# Patient Record
Sex: Male | Born: 1955
Health system: Southern US, Community
[De-identification: ages and names within clinical notes are randomized; demographics above are authoritative.]

## PROBLEM LIST (undated history)

## (undated) DIAGNOSIS — G8929 Other chronic pain: Secondary | ICD-10-CM

## (undated) DIAGNOSIS — C439 Malignant melanoma of skin, unspecified: Secondary | ICD-10-CM

---

## 2001-12-26 ENCOUNTER — Ambulatory Visit (HOSPITAL_COMMUNITY): Admission: RE | Admit: 2001-12-26 | Discharge: 2001-12-26 | Payer: Self-pay | Admitting: Family Medicine

## 2001-12-26 ENCOUNTER — Encounter: Payer: Self-pay | Admitting: Family Medicine

## 2001-12-28 ENCOUNTER — Encounter: Payer: Self-pay | Admitting: Urology

## 2001-12-28 ENCOUNTER — Ambulatory Visit (HOSPITAL_COMMUNITY): Admission: RE | Admit: 2001-12-28 | Discharge: 2001-12-28 | Payer: Self-pay | Admitting: Urology

## 2002-01-09 ENCOUNTER — Ambulatory Visit (HOSPITAL_COMMUNITY): Admission: RE | Admit: 2002-01-09 | Discharge: 2002-01-09 | Payer: Self-pay | Admitting: Urology

## 2002-01-09 ENCOUNTER — Encounter: Payer: Self-pay | Admitting: Urology

## 2002-01-18 ENCOUNTER — Ambulatory Visit (HOSPITAL_COMMUNITY): Admission: RE | Admit: 2002-01-18 | Discharge: 2002-01-18 | Payer: Self-pay | Admitting: Urology

## 2002-01-18 ENCOUNTER — Encounter: Payer: Self-pay | Admitting: Urology

## 2002-02-08 ENCOUNTER — Encounter: Payer: Self-pay | Admitting: Urology

## 2002-02-08 ENCOUNTER — Ambulatory Visit (HOSPITAL_COMMUNITY): Admission: RE | Admit: 2002-02-08 | Discharge: 2002-02-08 | Payer: Self-pay | Admitting: Urology

## 2002-03-20 ENCOUNTER — Ambulatory Visit (HOSPITAL_COMMUNITY): Admission: RE | Admit: 2002-03-20 | Discharge: 2002-03-20 | Payer: Self-pay | Admitting: Urology

## 2002-03-20 ENCOUNTER — Encounter: Payer: Self-pay | Admitting: Urology

## 2002-04-03 ENCOUNTER — Encounter: Payer: Self-pay | Admitting: Urology

## 2002-04-03 ENCOUNTER — Ambulatory Visit (HOSPITAL_COMMUNITY): Admission: RE | Admit: 2002-04-03 | Discharge: 2002-04-03 | Payer: Self-pay | Admitting: Urology

## 2002-04-18 ENCOUNTER — Encounter: Payer: Self-pay | Admitting: Urology

## 2002-04-18 ENCOUNTER — Ambulatory Visit (HOSPITAL_COMMUNITY): Admission: RE | Admit: 2002-04-18 | Discharge: 2002-04-18 | Payer: Self-pay | Admitting: Urology

## 2002-04-20 ENCOUNTER — Encounter: Payer: Self-pay | Admitting: Urology

## 2002-04-20 ENCOUNTER — Ambulatory Visit (HOSPITAL_COMMUNITY): Admission: RE | Admit: 2002-04-20 | Discharge: 2002-04-20 | Payer: Self-pay | Admitting: Urology

## 2005-07-22 ENCOUNTER — Encounter: Admission: RE | Admit: 2005-07-22 | Discharge: 2005-07-22 | Payer: Self-pay | Admitting: Sports Medicine

## 2005-08-11 ENCOUNTER — Encounter: Admission: RE | Admit: 2005-08-11 | Discharge: 2005-08-11 | Payer: Self-pay | Admitting: Sports Medicine

## 2005-08-19 ENCOUNTER — Encounter
Admission: RE | Admit: 2005-08-19 | Discharge: 2005-11-17 | Payer: Self-pay | Admitting: Physical Medicine and Rehabilitation

## 2005-08-19 ENCOUNTER — Ambulatory Visit: Payer: Self-pay | Admitting: Physical Medicine and Rehabilitation

## 2005-08-25 ENCOUNTER — Encounter: Admission: RE | Admit: 2005-08-25 | Discharge: 2005-08-25 | Payer: Self-pay | Admitting: Sports Medicine

## 2005-09-28 ENCOUNTER — Encounter
Admission: RE | Admit: 2005-09-28 | Discharge: 2005-12-16 | Payer: Self-pay | Admitting: Physical Medicine and Rehabilitation

## 2005-09-30 ENCOUNTER — Ambulatory Visit: Payer: Self-pay | Admitting: Physical Medicine and Rehabilitation

## 2005-12-03 ENCOUNTER — Ambulatory Visit: Payer: Self-pay | Admitting: Physical Medicine and Rehabilitation

## 2005-12-03 ENCOUNTER — Encounter
Admission: RE | Admit: 2005-12-03 | Discharge: 2006-03-03 | Payer: Self-pay | Admitting: Physical Medicine and Rehabilitation

## 2006-01-27 ENCOUNTER — Ambulatory Visit: Payer: Self-pay | Admitting: Orthopedic Surgery

## 2006-02-02 ENCOUNTER — Encounter: Admission: RE | Admit: 2006-02-02 | Discharge: 2006-02-02 | Payer: Self-pay | Admitting: Family Medicine

## 2006-02-23 ENCOUNTER — Encounter: Admission: RE | Admit: 2006-02-23 | Discharge: 2006-02-23 | Payer: Self-pay | Admitting: Family Medicine

## 2006-03-18 ENCOUNTER — Encounter: Admission: RE | Admit: 2006-03-18 | Discharge: 2006-03-18 | Payer: Self-pay | Admitting: Family Medicine

## 2008-01-03 ENCOUNTER — Ambulatory Visit (HOSPITAL_COMMUNITY): Admission: RE | Admit: 2008-01-03 | Discharge: 2008-01-03 | Payer: Self-pay | Admitting: Family Medicine

## 2008-03-26 ENCOUNTER — Encounter: Admission: RE | Admit: 2008-03-26 | Discharge: 2008-03-26 | Payer: Self-pay | Admitting: Family Medicine

## 2008-04-18 ENCOUNTER — Encounter: Admission: RE | Admit: 2008-04-18 | Discharge: 2008-04-18 | Payer: Self-pay | Admitting: Family Medicine

## 2009-04-15 ENCOUNTER — Encounter (INDEPENDENT_AMBULATORY_CARE_PROVIDER_SITE_OTHER): Payer: Self-pay | Admitting: *Deleted

## 2009-04-16 ENCOUNTER — Ambulatory Visit (HOSPITAL_COMMUNITY): Admission: RE | Admit: 2009-04-16 | Discharge: 2009-04-16 | Payer: Self-pay | Admitting: Family Medicine

## 2009-05-14 ENCOUNTER — Ambulatory Visit (HOSPITAL_COMMUNITY): Admission: RE | Admit: 2009-05-14 | Discharge: 2009-05-14 | Payer: Self-pay | Admitting: Family Medicine

## 2009-09-19 ENCOUNTER — Ambulatory Visit (HOSPITAL_COMMUNITY): Admission: RE | Admit: 2009-09-19 | Discharge: 2009-09-19 | Payer: Self-pay | Admitting: Family Medicine

## 2009-09-24 ENCOUNTER — Ambulatory Visit (HOSPITAL_COMMUNITY): Admission: RE | Admit: 2009-09-24 | Discharge: 2009-09-24 | Payer: Self-pay | Admitting: Family Medicine

## 2009-09-29 ENCOUNTER — Encounter: Admission: RE | Admit: 2009-09-29 | Discharge: 2009-09-29 | Payer: Self-pay | Admitting: Family Medicine

## 2010-10-04 ENCOUNTER — Encounter: Payer: Self-pay | Admitting: Family Medicine

## 2010-10-04 ENCOUNTER — Encounter: Payer: Self-pay | Admitting: Orthopedic Surgery

## 2010-10-05 ENCOUNTER — Ambulatory Visit (HOSPITAL_COMMUNITY): Admission: RE | Admit: 2010-10-05 | Payer: Self-pay | Source: Home / Self Care | Admitting: Internal Medicine

## 2010-10-14 ENCOUNTER — Encounter: Payer: Self-pay | Admitting: Internal Medicine

## 2010-12-18 LAB — GLUCOSE, CAPILLARY: Glucose-Capillary: 91 mg/dL (ref 70–99)

## 2011-01-29 NOTE — Assessment & Plan Note (Signed)
INTERVAL HISTORY:  Theodore Gordon is a 55 year old gentleman kindly referred by  Dr. Nobie Putnam.  Theodore Gordon is being seen in our pain and rehabilitative clinic  for chronic low back pain and predominantly left leg buttock pain radiating  to the lower left leg and bilateral knee pain.   Theodore Gordon states his pain is fairly constant throughout the day however on  further questioning he can discern that it seems a little worse at the  beginning of the day and toward the end of the day.  His pain is described  as constant and varying in its degree of sharpness, dullness, occasionally  stabbing, tingling and aching.  It varies with the weather.  The pain is  again 10 on a scale of 10.  He does not sleep well.  Pain is worse with  walking, bending, sitting, standing, improves with ice and medication.  He  did have a TENS trial, it seemed to help for about 40 minutes.  He has had  some improvement with injections as well.   Currently pain medications that he is on give him between a little and fair  relief.  He currently takes Valium 10 mg two times a day, Cymbalta 60 mg  once a day, Endocet 10/325 mg up to four to six times a day.   He also is on clonazepam.   FUNCTIONAL STATUS:  The patient is able to walk between 5 and 10 minutes at  a time.  He is independent with most of his self-care, may need some  assistance with dressing.   REVIEW OF SYSTEMS:  No changes in review of systems.   PAST MEDICAL, SOCIAL, FAMILY HISTORY:  No changes in past medical, social,  or family history, other than he reports that Dr. Nobie Putnam is scheduling him  for colonoscopy and upper GI series within the next month.   EXAMINATION:  Today blood pressure is 133/75, pulse 97, respirations 16, 97%  saturated on room air.  He is a well-developed, well-nourished gentleman.  He does not appear in any distress during our interview.  He is pleasant,  cooperative, and bright in his affect.  He is oriented x3.  He is able to  stand with a little difficulty after being seated.  He appears somewhat  stiff.  His gait in the room is antalgic.  Decreased weightbearing in the  left lower extremity.  Romberg's test is negative.  Seated reflexes are  symmetric at both patellar tendons 2+.  He has 5/5 motor strength at hip  flexors, knee extensors, dorsiflexors, plantar flexors.   Again, quite a bit of tenderness over the left lateral trochanter and on the  iliotibial band and also into the left gluteal musculature.   The patient brings in multiple scans today:  Bilateral knee MRIs dated  July 09, 2005, MRI of the pelvis dated July 09, 2005 and lumbar MRI  July 21,2006.   I do not have any radiologic report to go with these, we will be requesting  that.   Urine drug screen was positive for benzodiazepines and marijuana metabolites  which was repeated and verified.   IMPRESSION:  1.  Lumbar disk disease, mild.  2.  Lumbar facet syndrome, mild.  3.  Left trochanteric bursitis/iliotibial band syndrome.  4.  Intermittent left sciatic symptoms.  5.  Illegal substance use on urine drug screen dated August 20, 2005      positive for marijuana metabolites.   PLAN:  The patient has just gotten  started in his physical therapy program,  would like him to pursue this vigorously; education and proper body  mechanics, ergonomics, postural training, pain management techniques,  ultrasound to the iliotibial band and soft tissue work in the gluteal  musculature.  He has been assessed for a TENS unit, appears that he does get  some benefit, would like to go ahead and get this ordered for him.  We will  do a trial of gabapentin in the evening to see if we can help him rest  better.  We also discussed the possibility of either epidurals or medial  branch blocks in the upcoming months as well.  We also discussed the  possibility of adding a nonsteroidal and possibly ibuprofen 600 in the  morning.  Would like to wait for  results from colonoscopy and upper GI prior  to starting him on this.  Our goal is to minimize use of Endocet if we can.   Due to use of illegal substances, this clinic will not be providing narcotic  substances for this gentleman but we will be happy to continue to treat him  in a non-narcotic capacity.   We will see him back in 6 weeks.           ______________________________  Brantley Stage, M.D.     DMK/MedQ  D:  10/01/2005 14:16:58  T:  10/01/2005 18:02:40  Job #:  [id number]   cc:   Patrica Duel, M.D.  Fax: [phone]
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
## 2011-01-29 NOTE — H&P (Signed)
NAME:  Theodore Gordon, Theodore Gordon                              ACCOUNT NO.:  [id number]   MEDICAL RECORD NO.:  [id number]                   PATIENT TYPE:  AMB   LOCATION:  DAY                                  FACILITY:  [facility]   PHYSICIAN:  Dennie Maizes, M.D.                DATE OF BIRTH:  [birth year]   DATE OF ADMISSION:  04/18/2002  DATE OF DISCHARGE:                                HISTORY & PHYSICAL   CHIEF COMPLAINT:  Severe left flank pain, left upper ureter calculus with  obstruction.   HISTORY OF PRESENT ILLNESS:  This 55 year old male was initially referred to  me by Dr.  Nobie Putnam in April 2003 for evaluation of left flank pain.  X-rays  revealed a large left renal calculus measuring 20 x 35 x 15 mm in size.  The  patient underwent cystoscopy, left ureteral stent placement, and ESWL of the  left renal calculus.  Followup x-rays revealed that the stone was fragmented  well.  The ureteral stent was removed, and the patient passed several stone  fragments.  He has a 10 x 7 mm size left upper ureteral stone fragment at  the lower left L5 transverse process.  He is unable to pass the stone, and  he is quit symptomatic from the stone at present.  He has intermittent  severe low flank pain. There is no history of fever, chills, gross hematuria  or dysuria.  He is brought to the day hospital today for ESL of the left  upper ureter calculus with IV sedation.   PAST MEDICAL HISTORY:  No medical illnesses.  History of urolithiasis status  post ESL of left renal calculus in April 2003.  Status post cholecystectomy,  appendectomy, right inguinal hernia repair, bilateral vasectomy, history of  chronic back pain.   MEDICATIONS:  1. Darvocet-N 100 p.r.n. for pain.  2. Valium 5 mg p.o. q.8h. for back spasm.   ALLERGIES:  CODEINE.   REVIEW OF SYSTEMS:  Negative.   FAMILY HISTORY:  Positive for kidney stones, colon cancer, hypertension.   PHYSICAL EXAMINATION:  VITAL SIGNS:  Height 5 feet 11 inches,  weight 185  pounds.  HEENT:  Normal.  NECK:  No masses.  LUNGS:  Clear to auscultation.  HEART:  Regular rate and rhythm, no murmurs.  ABDOMEN:  Soft.  No palpable flank mass.  Moderate costovertebral angle  tenderness as noted.  Bladder not palpable.  GU:  Penis and testes are normal.  There is a 5 cm size right spermatocele.  RECTAL:  Examination not done.   IMPRESSION:  1. Left renal colic.  2. Left upper ureteral calculus with obstruction.   PLAN:  I discussed with the patient regarding the diagnosis and management  options.  He is scheduled to undergo extracorporeal shock wave lithotripsy  of the left upper ureteral calculus under IV sedation at [facility].  I informed him regarding the diagnosis, operative details,  alternative treatments, outcomes, possible risks and complications, and he  has agreed for the procedure to be done.                                               Dennie Maizes, M.D.    SK/MEDQ  D:  04/17/2002  T:  04/17/2002  Job:  [id number]   cc:   Jonell Cluck, M.D.

## 2011-01-29 NOTE — Assessment & Plan Note (Signed)
INTERVAL HISTORY:  [name] is back in today for a recheck.  He was last  seen October 01, 2005.  At the last visit he was set up to see a physical  therapist to educate him in proper body mechanics, ergonomics, proper  posture, pain management techniques, TENS trial, IT band, ultrasound, and  exercise program for trochanteric bursitis with manual PT.   He states he has had multiple visits.  He is not sure that the therapy has  helped much.  TENS may help a little bit.  He feels he has learned something  regarding the use of proper body mechanics and ergonomics.   He states his pain still varies between about 7 and 8 on a scale of 10,  occasionally up to a 9 or 10.  He has pain at the thoracolumbar junction  which he states is about a 7.  He has pain in the left buttock area and  across the low back in that same area which is an 8 or 9 on a scale of 10  and pain down the left leg.  He uses a cane for ambulation.   He states he sleeps poorly.  Pain is typically worse with activity, improves  with rest, heat, ice, pacing, activities, TENS unit helps a little bit,  medications help.  He gets between a little and fair relief with the current  meds that he is on.   He is able to walk about 5-10 minutes at a time.  He is able to climb  stairs.  He is driving.  He is currently disabled.  Health and history form  are reviewed regarding review of systems.  He states he has had suicidal  ideation in the past, not currently.  He does admit to depression and  anxiety, however.  Regarding his recent medical history, he is followed up  with Dr. Cleotis Nipper in [place] who is with [facility] and  underwent colonoscopy and imaging studies of his abdomen which revealed a  small benign polyp in the colon and benign liver cysts with apparently  normal liver function tests which were recently done by Dr. Cleotis Nipper.  This  information was obtained through a conversation with Dr. Cleotis Nipper  today  after my office visit with Theodore Gordon.  His case was also discussed briefly  with Dr. Geanie Logan nurse who states she was unaware of the results of the  tests just discussed.   No changes in social or family history since our last visit.   EXAMINATION:  Blood pressure 126/77, pulse 87, respirations 16, 99%  saturated on room air.   He is alert, cooperative, pleasant, oriented, although during our interview  he does become somewhat irritable and frustrated.   He has questions regarding use of narcotic pain medications in this clinic  today.  I review his urine drug screen with him.  He is aware that he was  positive for marijuana metabolites back in December.  He is quite upset by  this.  We discussed the fact that we can provide him non-narcotic pain  management and discussed alternative measures using medications, TENS unit,  possibly injections, I review various injections which may give him some  relief as well as physical therapy techniques.   He at this point after this discussion still appears rather frustrated.  I  offer to provide him with a list of various pain clinics in the area which  he may pursue.  In light of his discussion  regarding the liver at the time  he was in our office I really was not aware of what sort of problem he was  having in his liver and at that point he elected not to be provided with any  medications at this visit until we were clear on whether or not he has good  liver function.  In light of that we did make a followup appointment for him  in 3 weeks, may consider increasing his gabapentin to help him  sleep at night a little better or may consider switching him to Lyrica.  I  did discuss this with him as well.  He will let us know if he will continue  to pursue pain management through our clinic or consider outside clinic.           ______________________________  Brantley Stage, M.D.     DMK/MedQ  D:  12/06/2005 12:49:59  T:   12/07/2005 22:00:01  Job #:  [id number]   cc:   Patrica Duel, M.D.  Fax: [phone]

## 2011-01-29 NOTE — Op Note (Signed)
[facility]  Patient:    Theodore Gordon, Theodore Gordon Visit Number: [id number] MRN: [id number]          Service Type: DSU Location: DAY Attending Physician:  Dennie Maizes Dictated by:   Dennie Maizes, M.D. Proc. Date: 12/28/01 Admit Date:  12/28/2001   CC:         Jonell Cluck, M.D.   Operative Report  PREOPERATIVE DIAGNOSES:  Large left renal calculus, left renal colic, left hydronephrosis.  POSTOPERATIVE DIAGNOSES:  Large left renal calculus, left renal colic, left hydronephrosis.  OPERATION PERFORMED:  Cystoscopy, left retrograde pyelogram and left ureteral stent placement.  ANESTHESIA:  General.  SURGEON:  Dennie Maizes, M.D.  COMPLICATIONS:  None.  DRAINS:  6 French 36 cm size left ureteral stent.  INDICATIONS FOR PROCEDURE:  This 55 year old male with severe left flank pain was evaluated with a CT scan of the abdomen and pelvis. This revealed a 25 x 15 mm size left renal calculus with obstruction. The patient was taken to the OR today for cystoscopy, left retrograde pyelogram and left ureteral stent placement. The stone on the left was removed with ESWL as an outpatient.  DESCRIPTION OF PROCEDURE:  General anesthesia was induced and the patient was placed on the OR table in the dorsal lithotomy position. The lower abdomen and genitalia were prepped and draped in a sterile fashion. Cystoscopy was done with a 25 [place] scope. The urethra, bladder, and prostate were normal. No abnormality inside the bladder was noted. A 5 French catheter was then placed in the left ureteral orifice and retrograde pyelogram was done by using about 7 cc of ______ 60 with C-arm fluoroscopy. The ureter was normal. There was a large filling defect in the left renal pelvis due to the stone. There was moderate left hydronephrosis. A 5 French open ended catheter was then placed in the left distal ureter. A 0.038 inch Benson guidewire with a proximal tip was then  inserted into the left collecting system. The open ended catheter was then removed. A 6 French 26 cm ______ was then placed over the guidewire and then pushed into the left collecting system. The instruments are removed. The patient was transferred to the PACU in a satisfactory condition. Dictated by:   Dennie Maizes, M.D. Attending Physician:  Dennie Maizes DD:  12/28/01 TD:  12/29/01 Job: [id number]

## 2011-04-23 ENCOUNTER — Emergency Department (HOSPITAL_COMMUNITY)
Admission: EM | Admit: 2011-04-23 | Discharge: 2011-04-24 | Disposition: A | Discharge status: Other Acute Inpatient Hospital | Payer: Medicare Other | Source: Home / Self Care | Attending: Emergency Medicine | Admitting: Emergency Medicine

## 2011-04-23 ENCOUNTER — Encounter: Payer: Self-pay | Admitting: Emergency Medicine

## 2011-04-23 DIAGNOSIS — R110 Nausea: Secondary | ICD-10-CM | POA: Insufficient documentation

## 2011-04-23 DIAGNOSIS — M545 Low back pain, unspecified: Secondary | ICD-10-CM | POA: Insufficient documentation

## 2011-04-23 DIAGNOSIS — F329 Major depressive disorder, single episode, unspecified: Secondary | ICD-10-CM

## 2011-04-23 DIAGNOSIS — R45851 Suicidal ideations: Secondary | ICD-10-CM

## 2011-04-23 DIAGNOSIS — G8929 Other chronic pain: Secondary | ICD-10-CM

## 2011-04-23 DIAGNOSIS — Z79899 Other long term (current) drug therapy: Secondary | ICD-10-CM | POA: Insufficient documentation

## 2011-04-23 DIAGNOSIS — F32A Depression, unspecified: Secondary | ICD-10-CM

## 2011-04-23 DIAGNOSIS — M62838 Other muscle spasm: Secondary | ICD-10-CM | POA: Insufficient documentation

## 2011-04-23 DIAGNOSIS — F3289 Other specified depressive episodes: Secondary | ICD-10-CM | POA: Insufficient documentation

## 2011-04-23 DIAGNOSIS — M549 Dorsalgia, unspecified: Secondary | ICD-10-CM | POA: Insufficient documentation

## 2011-04-23 HISTORY — DX: Malignant melanoma of skin, unspecified: C43.9

## 2011-04-23 HISTORY — DX: Other chronic pain: G89.29

## 2011-04-23 LAB — RAPID URINE DRUG SCREEN, HOSP PERFORMED
Amphetamines: NOT DETECTED
Barbiturates: NOT DETECTED
Benzodiazepines: POSITIVE — AB
Cocaine: NOT DETECTED
Opiates: NOT DETECTED
Tetrahydrocannabinol: POSITIVE — AB

## 2011-04-23 LAB — ETHANOL: Alcohol, Ethyl (B): <11 mg/dL (ref 0–11)

## 2011-04-23 LAB — CBC
HCT: 36.9 % — ABNORMAL LOW (ref 39.0–52.0)
Hemoglobin: 12.4 g/dL — ABNORMAL LOW (ref 13.0–17.0)
MCH: 29 pg (ref 26.0–34.0)
MCHC: 33.6 g/dL (ref 30.0–36.0)
MCV: 86.4 fL (ref 78.0–100.0)
Platelets: 282 10*3/uL (ref 150–400)
RBC: 4.27 MIL/uL (ref 4.22–5.81)
RDW: 14.7 % (ref 11.5–15.5)
WBC: 6.3 10*3/uL (ref 4.0–10.5)

## 2011-04-23 LAB — BASIC METABOLIC PANEL WITH GFR
BUN: 12 mg/dL (ref 6–23)
Creatinine, Ser: 1.06 mg/dL (ref 0.50–1.35)
GFR calc non Af Amer: >60 mL/min (ref >60)
Glucose, Bld: 122 mg/dL — ABNORMAL HIGH (ref 70–99)
Potassium: 4.6 meq/L (ref 3.5–5.1)

## 2011-04-23 LAB — BASIC METABOLIC PANEL
CO2: 23 mEq/L (ref 19–32)
Calcium: 9.6 mg/dL (ref 8.4–10.5)
Chloride: 108 mEq/L (ref 96–112)
GFR calc Af Amer: >60 mL/min (ref >60)
Sodium: 143 mEq/L (ref 135–145)

## 2011-04-23 MED ORDER — METHOCARBAMOL 500 MG PO TABS
1000.0000 mg | ORAL_TABLET | Freq: Once | ORAL | Status: AC
  Administered 2011-04-23: 1000 mg via ORAL
  Filled 2011-04-23: qty 2

## 2011-04-23 MED ORDER — LORAZEPAM 1 MG PO TABS: 1.0000 mg | ORAL_TABLET | Freq: Three times a day (TID) | ORAL | Status: DC | PRN

## 2011-04-23 MED ORDER — OXYCODONE HCL 5 MG PO TABS
20.0000 mg | ORAL_TABLET | Freq: Once | ORAL | Status: AC
  Administered 2011-04-23: 20 mg via ORAL
  Filled 2011-04-23: qty 4

## 2011-04-23 MED ORDER — ONDANSETRON HCL 4 MG PO TABS
4.0000 mg | ORAL_TABLET | Freq: Three times a day (TID) | ORAL | Status: DC | PRN
  Administered 2011-04-23: 4 mg via ORAL
  Filled 2011-04-23: qty 1

## 2011-04-23 MED ORDER — OXYCODONE HCL 5 MG PO TABS: 20.0000 mg | ORAL_TABLET | Freq: Four times a day (QID) | ORAL | Status: DC | PRN
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
## 2011-04-23 NOTE — ED Notes (Signed)
Pt shirt, jean, shoes, & hat lock in pt cabinet. sercurity logged & locked up valuables. Listing of valuables placed on pt chart.
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
## 2011-04-23 NOTE — ED Notes (Signed)
Patient presents to ER on IVC with suicidal ideations. Patient states he would inject himself with air.

## 2011-04-24 ENCOUNTER — Inpatient Hospital Stay (HOSPITAL_COMMUNITY)
Admission: EM | Admit: 2011-04-24 | Discharge: 2011-04-26 | DRG: 885 | Disposition: A | Discharge status: Home / Self Care | Payer: Medicare Other | Source: Intra-hospital | Attending: Psychiatry | Admitting: Psychiatry

## 2011-04-24 ENCOUNTER — Encounter (HOSPITAL_COMMUNITY): Payer: Self-pay | Admitting: Emergency Medicine

## 2011-04-24 DIAGNOSIS — G8929 Other chronic pain: Secondary | ICD-10-CM

## 2011-04-24 DIAGNOSIS — F1210 Cannabis abuse, uncomplicated: Secondary | ICD-10-CM

## 2011-04-24 DIAGNOSIS — C4499 Other specified malignant neoplasm of skin, unspecified: Secondary | ICD-10-CM

## 2011-04-24 DIAGNOSIS — R45851 Suicidal ideations: Secondary | ICD-10-CM

## 2011-04-24 DIAGNOSIS — F322 Major depressive disorder, single episode, severe without psychotic features: Secondary | ICD-10-CM

## 2011-04-24 DIAGNOSIS — M25569 Pain in unspecified knee: Secondary | ICD-10-CM

## 2011-04-24 DIAGNOSIS — F1110 Opioid abuse, uncomplicated: Secondary | ICD-10-CM

## 2011-04-24 DIAGNOSIS — M549 Dorsalgia, unspecified: Secondary | ICD-10-CM

## 2011-04-24 DIAGNOSIS — F332 Major depressive disorder, recurrent severe without psychotic features: Principal | ICD-10-CM

## 2011-04-24 NOTE — ED Notes (Signed)
Pt called this nurse into the room. Pt states that he is in pain like always & it feels like he is being tortured. Pt states he wants to leave, advised pt he is unable able to leave d/t ivc papers. Pt requesting to speak w/ edp.

## 2011-04-24 NOTE — ED Notes (Signed)
Pt states the lady he called to talk w/ has twisted his words & all he wants to do is go home. Pt advised again tat they had taken ivc papers out & was being taken to [facility] for further evaluation. Pt escorted out by [employer].

## 2011-04-24 NOTE — ED Notes (Signed)
[facility] called for transport to [facility].

## 2011-04-24 NOTE — ED Notes (Signed)
Pt sitting on stretcher watching tv at this time. Sitter remains outside room. NAD noted.

## 2011-04-28 NOTE — Discharge Summary (Signed)
  Theodore Gordon, Theodore Gordon                    ACCOUNT NO.:  [id number]  MEDICAL RECORD NO.:  [id number]  LOCATION:  0501                          FACILITY:  [facility]  PHYSICIAN:  Franchot Gallo, MD     DATE OF BIRTH:  Dec 21, 1955  DATE OF ADMISSION:  04/24/2011 DATE OF DISCHARGE:  04/26/2011                              DISCHARGE SUMMARY   REASON FOR ADMISSION:  This is a 55 year old male that was admitted on commitment papers.  States he was here because his words were misrepresented when he called the crisis line when he was complaining about his antidepressants that were giving him side effects.  Papers state that the patient was having suicidal thoughts and he had a plan to inject air into his veins with a syringe.  The patient has multiple medical issues, history of chronic pain, recent melanoma.  FINAL IMPRESSION:  Axis I:  Major depressive disorder recurrent, mild. Axis II: Deferred. Axis III:  Melanoma, chronic pain. Axis IV: Chronic medical issues. Axis V: GAF at discharge is 65.  PERTINENT LABS:  Urine drug screen is positive for marijuana and benzodiazepines negative for opiates.  Alcohol level not measurable.  SIGNIFICANT FINDINGS:  The patient was admitted to the adult milieu for safety and stabilization.  He was placed on Librium detox protocol and his Valium and oxycodone were not continued during his hospital stay but we did have Tramadol available for pain.  He was refusing an antidepressant at first because he did not want to be doped up.  We did order Skelaxin as stated for muscle spasms and monitored his mood and affect.  The patient refused contact with family or significant other. He did not want any of his family to know he was at the [facility].  On day of discharge the patient was seen in the interdisciplinary treatment team.  He was reporting problems with sleep but contributed to his chronic pain.  His appetite was good.  He takes supplements.  His  depression rated 1-2 on scale of 1-10.  He stated that he was not suicidal, that he loves life.  He denied any homicidal ideation or any type of psychotic symptoms, having mild anxiety rating it a 2 on a scale of 1-10.  Denied any side effects.  He could not tolerate Cymbalta but was agreeable to starting Prozac.  The patient was stable for discharge.  DISCHARGE MEDICATIONS: 1. Nortriptyline 25 mg at bedtime, taking 2 tablets for sleep and     depression. 2. Prozac 20 mg one daily for depression. 3. Multivitamin daily. 4. The patient was to stop taking his diazepam and Remeron.  FOLLOW-UP APPOINTMENT:  With [facility] at phone number [phone] on Wednesday August 15 at 8 a.m.     Landry Corporal, N.P.   ______________________________ Franchot Gallo, MD    JO/MEDQ  D:  04/27/2011  T:  04/28/2011  Job:  [id number]  Electronically Signed by Limmie PatriciaP. on 04/28/2011 08:53:28 AM Electronically Signed by Franchot Gallo MD on 04/28/2011 04:06:19 PM
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
## 2014-12-12 ENCOUNTER — Other Ambulatory Visit (HOSPITAL_COMMUNITY): Payer: Self-pay | Admitting: Family Medicine

## 2014-12-12 DIAGNOSIS — M5432 Sciatica, left side: Secondary | ICD-10-CM

## 2014-12-18 ENCOUNTER — Ambulatory Visit (HOSPITAL_COMMUNITY)
Admission: RE | Admit: 2014-12-18 | Discharge: 2014-12-18 | Disposition: A | Discharge status: Home / Self Care | Payer: Medicare Other | Source: Ambulatory Visit | Attending: Family Medicine | Admitting: Family Medicine

## 2014-12-18 DIAGNOSIS — M5432 Sciatica, left side: Secondary | ICD-10-CM | POA: Diagnosis present

## 2014-12-27 ENCOUNTER — Ambulatory Visit (HOSPITAL_COMMUNITY)
Admission: RE | Admit: 2014-12-27 | Discharge: 2014-12-27 | Disposition: A | Discharge status: Home / Self Care | Payer: Medicare Other | Source: Ambulatory Visit | Attending: Physician Assistant | Admitting: Physician Assistant

## 2014-12-27 ENCOUNTER — Other Ambulatory Visit (HOSPITAL_COMMUNITY): Payer: Self-pay | Admitting: Physician Assistant

## 2014-12-27 DIAGNOSIS — M25552 Pain in left hip: Secondary | ICD-10-CM | POA: Diagnosis present

## 2014-12-27 DIAGNOSIS — M160 Bilateral primary osteoarthritis of hip: Secondary | ICD-10-CM | POA: Diagnosis not present

## 2015-03-25 ENCOUNTER — Telehealth: Payer: Self-pay | Admitting: Family Medicine

## 2015-03-25 NOTE — Telephone Encounter (Addendum)
Patient states he is on oxycodone, morphine, amitriptyline, tizadine, compound cream- Patient advised that we will not be able to see since we do not do pain management here at this office.
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
## 2016-10-15 DIAGNOSIS — G8929 Other chronic pain: Secondary | ICD-10-CM | POA: Diagnosis not present

## 2016-10-15 DIAGNOSIS — C787 Secondary malignant neoplasm of liver and intrahepatic bile duct: Secondary | ICD-10-CM | POA: Diagnosis not present

## 2016-10-15 DIAGNOSIS — Z9889 Other specified postprocedural states: Secondary | ICD-10-CM | POA: Diagnosis not present

## 2016-10-15 DIAGNOSIS — C7951 Secondary malignant neoplasm of bone: Secondary | ICD-10-CM | POA: Diagnosis not present

## 2016-10-15 DIAGNOSIS — C799 Secondary malignant neoplasm of unspecified site: Secondary | ICD-10-CM | POA: Diagnosis not present

## 2016-10-15 DIAGNOSIS — Z85828 Personal history of other malignant neoplasm of skin: Secondary | ICD-10-CM | POA: Diagnosis not present

## 2016-10-15 DIAGNOSIS — C7931 Secondary malignant neoplasm of brain: Secondary | ICD-10-CM | POA: Diagnosis not present

## 2016-10-15 DIAGNOSIS — C7940 Secondary malignant neoplasm of unspecified part of nervous system: Secondary | ICD-10-CM | POA: Diagnosis not present

## 2016-10-19 DIAGNOSIS — C801 Malignant (primary) neoplasm, unspecified: Secondary | ICD-10-CM | POA: Diagnosis not present

## 2016-10-19 DIAGNOSIS — C7931 Secondary malignant neoplasm of brain: Secondary | ICD-10-CM | POA: Diagnosis not present

## 2016-10-26 DIAGNOSIS — Z888 Allergy status to other drugs, medicaments and biological substances status: Secondary | ICD-10-CM | POA: Diagnosis not present

## 2016-10-26 DIAGNOSIS — C799 Secondary malignant neoplasm of unspecified site: Secondary | ICD-10-CM | POA: Diagnosis not present

## 2016-10-26 DIAGNOSIS — F329 Major depressive disorder, single episode, unspecified: Secondary | ICD-10-CM | POA: Diagnosis not present

## 2016-10-26 DIAGNOSIS — Z923 Personal history of irradiation: Secondary | ICD-10-CM | POA: Diagnosis not present

## 2016-10-26 DIAGNOSIS — C7951 Secondary malignant neoplasm of bone: Secondary | ICD-10-CM | POA: Diagnosis not present

## 2016-10-26 DIAGNOSIS — K5903 Drug induced constipation: Secondary | ICD-10-CM | POA: Diagnosis not present

## 2016-10-26 DIAGNOSIS — C7931 Secondary malignant neoplasm of brain: Secondary | ICD-10-CM | POA: Diagnosis not present

## 2016-10-26 DIAGNOSIS — Z79899 Other long term (current) drug therapy: Secondary | ICD-10-CM | POA: Diagnosis not present

## 2016-10-26 DIAGNOSIS — F419 Anxiety disorder, unspecified: Secondary | ICD-10-CM | POA: Diagnosis not present

## 2016-10-26 DIAGNOSIS — R530 Neoplastic (malignant) related fatigue: Secondary | ICD-10-CM | POA: Diagnosis not present

## 2016-10-26 DIAGNOSIS — Z886 Allergy status to analgesic agent status: Secondary | ICD-10-CM | POA: Diagnosis not present

## 2016-10-26 DIAGNOSIS — Z87891 Personal history of nicotine dependence: Secondary | ICD-10-CM | POA: Diagnosis not present

## 2016-10-26 DIAGNOSIS — G893 Neoplasm related pain (acute) (chronic): Secondary | ICD-10-CM | POA: Diagnosis not present

## 2016-10-26 DIAGNOSIS — Z515 Encounter for palliative care: Secondary | ICD-10-CM | POA: Diagnosis not present

## 2016-10-26 DIAGNOSIS — C779 Secondary and unspecified malignant neoplasm of lymph node, unspecified: Secondary | ICD-10-CM | POA: Diagnosis not present

## 2016-10-26 DIAGNOSIS — Z885 Allergy status to narcotic agent status: Secondary | ICD-10-CM | POA: Diagnosis not present

## 2016-10-26 DIAGNOSIS — C787 Secondary malignant neoplasm of liver and intrahepatic bile duct: Secondary | ICD-10-CM | POA: Diagnosis not present

## 2016-10-26 DIAGNOSIS — C7932 Secondary malignant neoplasm of cerebral meninges: Secondary | ICD-10-CM | POA: Diagnosis not present

## 2016-10-26 DIAGNOSIS — Z9889 Other specified postprocedural states: Secondary | ICD-10-CM | POA: Diagnosis not present

## 2016-10-26 DIAGNOSIS — Z9221 Personal history of antineoplastic chemotherapy: Secondary | ICD-10-CM | POA: Diagnosis not present

## 2016-10-26 DIAGNOSIS — C439 Malignant melanoma of skin, unspecified: Secondary | ICD-10-CM | POA: Diagnosis not present

## 2016-10-26 DIAGNOSIS — T402X5A Adverse effect of other opioids, initial encounter: Secondary | ICD-10-CM | POA: Diagnosis not present

## 2016-10-26 DIAGNOSIS — M25511 Pain in right shoulder: Secondary | ICD-10-CM | POA: Diagnosis not present

## 2016-11-12 DIAGNOSIS — Z9889 Other specified postprocedural states: Secondary | ICD-10-CM | POA: Diagnosis not present

## 2016-11-12 DIAGNOSIS — C7931 Secondary malignant neoplasm of brain: Secondary | ICD-10-CM | POA: Diagnosis not present

## 2016-11-12 DIAGNOSIS — C787 Secondary malignant neoplasm of liver and intrahepatic bile duct: Secondary | ICD-10-CM | POA: Diagnosis not present

## 2016-11-12 DIAGNOSIS — C799 Secondary malignant neoplasm of unspecified site: Secondary | ICD-10-CM | POA: Diagnosis not present

## 2016-11-12 DIAGNOSIS — C7970 Secondary malignant neoplasm of unspecified adrenal gland: Secondary | ICD-10-CM | POA: Diagnosis not present

## 2016-11-12 DIAGNOSIS — Z923 Personal history of irradiation: Secondary | ICD-10-CM | POA: Diagnosis not present

## 2016-11-12 DIAGNOSIS — C7951 Secondary malignant neoplasm of bone: Secondary | ICD-10-CM | POA: Diagnosis not present

## 2016-11-12 DIAGNOSIS — C7940 Secondary malignant neoplasm of unspecified part of nervous system: Secondary | ICD-10-CM | POA: Diagnosis not present

## 2016-11-12 DIAGNOSIS — C801 Malignant (primary) neoplasm, unspecified: Secondary | ICD-10-CM | POA: Diagnosis not present

## 2016-11-23 DIAGNOSIS — M84459D Pathological fracture, hip, unspecified, subsequent encounter for fracture with routine healing: Secondary | ICD-10-CM | POA: Diagnosis not present

## 2016-11-23 DIAGNOSIS — C7951 Secondary malignant neoplasm of bone: Secondary | ICD-10-CM | POA: Diagnosis not present

## 2016-11-23 DIAGNOSIS — M160 Bilateral primary osteoarthritis of hip: Secondary | ICD-10-CM | POA: Diagnosis not present

## 2016-11-23 DIAGNOSIS — M898X8 Other specified disorders of bone, other site: Secondary | ICD-10-CM | POA: Diagnosis not present

## 2016-11-23 DIAGNOSIS — M75111 Incomplete rotator cuff tear or rupture of right shoulder, not specified as traumatic: Secondary | ICD-10-CM | POA: Diagnosis not present

## 2016-12-10 DIAGNOSIS — C787 Secondary malignant neoplasm of liver and intrahepatic bile duct: Secondary | ICD-10-CM | POA: Diagnosis not present

## 2016-12-10 DIAGNOSIS — Z85828 Personal history of other malignant neoplasm of skin: Secondary | ICD-10-CM | POA: Diagnosis not present

## 2016-12-10 DIAGNOSIS — Z79899 Other long term (current) drug therapy: Secondary | ICD-10-CM | POA: Diagnosis not present

## 2016-12-10 DIAGNOSIS — C7931 Secondary malignant neoplasm of brain: Secondary | ICD-10-CM | POA: Diagnosis not present

## 2016-12-10 DIAGNOSIS — C7951 Secondary malignant neoplasm of bone: Secondary | ICD-10-CM | POA: Diagnosis not present

## 2016-12-10 DIAGNOSIS — G8929 Other chronic pain: Secondary | ICD-10-CM | POA: Diagnosis not present

## 2016-12-10 DIAGNOSIS — C799 Secondary malignant neoplasm of unspecified site: Secondary | ICD-10-CM | POA: Diagnosis not present

## 2016-12-17 DIAGNOSIS — C801 Malignant (primary) neoplasm, unspecified: Secondary | ICD-10-CM | POA: Diagnosis not present

## 2016-12-17 DIAGNOSIS — C7931 Secondary malignant neoplasm of brain: Secondary | ICD-10-CM | POA: Diagnosis not present

## 2016-12-28 DIAGNOSIS — G893 Neoplasm related pain (acute) (chronic): Secondary | ICD-10-CM | POA: Diagnosis not present

## 2016-12-28 DIAGNOSIS — F419 Anxiety disorder, unspecified: Secondary | ICD-10-CM | POA: Diagnosis not present

## 2016-12-28 DIAGNOSIS — S46009D Unspecified injury of muscle(s) and tendon(s) of the rotator cuff of unspecified shoulder, subsequent encounter: Secondary | ICD-10-CM | POA: Diagnosis not present

## 2016-12-28 DIAGNOSIS — C799 Secondary malignant neoplasm of unspecified site: Secondary | ICD-10-CM | POA: Diagnosis not present

## 2016-12-28 DIAGNOSIS — X58XXXD Exposure to other specified factors, subsequent encounter: Secondary | ICD-10-CM | POA: Diagnosis not present

## 2016-12-28 DIAGNOSIS — R110 Nausea: Secondary | ICD-10-CM | POA: Diagnosis not present

## 2016-12-28 DIAGNOSIS — Z923 Personal history of irradiation: Secondary | ICD-10-CM | POA: Diagnosis not present

## 2016-12-28 DIAGNOSIS — T402X5A Adverse effect of other opioids, initial encounter: Secondary | ICD-10-CM | POA: Diagnosis not present

## 2016-12-28 DIAGNOSIS — C439 Malignant melanoma of skin, unspecified: Secondary | ICD-10-CM | POA: Diagnosis not present

## 2016-12-28 DIAGNOSIS — C7970 Secondary malignant neoplasm of unspecified adrenal gland: Secondary | ICD-10-CM | POA: Diagnosis not present

## 2016-12-28 DIAGNOSIS — Z87891 Personal history of nicotine dependence: Secondary | ICD-10-CM | POA: Diagnosis not present

## 2016-12-28 DIAGNOSIS — C779 Secondary and unspecified malignant neoplasm of lymph node, unspecified: Secondary | ICD-10-CM | POA: Diagnosis not present

## 2016-12-28 DIAGNOSIS — C787 Secondary malignant neoplasm of liver and intrahepatic bile duct: Secondary | ICD-10-CM | POA: Diagnosis not present

## 2016-12-28 DIAGNOSIS — M25519 Pain in unspecified shoulder: Secondary | ICD-10-CM | POA: Diagnosis not present

## 2016-12-28 DIAGNOSIS — C7951 Secondary malignant neoplasm of bone: Secondary | ICD-10-CM | POA: Diagnosis not present

## 2016-12-28 DIAGNOSIS — M25559 Pain in unspecified hip: Secondary | ICD-10-CM | POA: Diagnosis not present

## 2016-12-28 DIAGNOSIS — C7931 Secondary malignant neoplasm of brain: Secondary | ICD-10-CM | POA: Diagnosis not present

## 2016-12-28 DIAGNOSIS — F329 Major depressive disorder, single episode, unspecified: Secondary | ICD-10-CM | POA: Diagnosis not present

## 2016-12-28 DIAGNOSIS — G629 Polyneuropathy, unspecified: Secondary | ICD-10-CM | POA: Diagnosis not present

## 2016-12-28 DIAGNOSIS — Z515 Encounter for palliative care: Secondary | ICD-10-CM | POA: Diagnosis not present

## 2016-12-28 DIAGNOSIS — K5903 Drug induced constipation: Secondary | ICD-10-CM | POA: Diagnosis not present

## 2016-12-28 DIAGNOSIS — Z79891 Long term (current) use of opiate analgesic: Secondary | ICD-10-CM | POA: Diagnosis not present

## 2016-12-31 DIAGNOSIS — C7951 Secondary malignant neoplasm of bone: Secondary | ICD-10-CM | POA: Diagnosis not present

## 2016-12-31 DIAGNOSIS — G8929 Other chronic pain: Secondary | ICD-10-CM | POA: Diagnosis not present

## 2016-12-31 DIAGNOSIS — C799 Secondary malignant neoplasm of unspecified site: Secondary | ICD-10-CM | POA: Diagnosis not present

## 2016-12-31 DIAGNOSIS — C7931 Secondary malignant neoplasm of brain: Secondary | ICD-10-CM | POA: Diagnosis not present

## 2016-12-31 DIAGNOSIS — C7940 Secondary malignant neoplasm of unspecified part of nervous system: Secondary | ICD-10-CM | POA: Diagnosis not present

## 2016-12-31 DIAGNOSIS — C787 Secondary malignant neoplasm of liver and intrahepatic bile duct: Secondary | ICD-10-CM | POA: Diagnosis not present

## 2016-12-31 DIAGNOSIS — C434 Malignant melanoma of scalp and neck: Secondary | ICD-10-CM | POA: Diagnosis not present

## 2016-12-31 DIAGNOSIS — Z923 Personal history of irradiation: Secondary | ICD-10-CM | POA: Diagnosis not present

## 2016-12-31 DIAGNOSIS — Z9221 Personal history of antineoplastic chemotherapy: Secondary | ICD-10-CM | POA: Diagnosis not present

## 2016-12-31 DIAGNOSIS — Z85828 Personal history of other malignant neoplasm of skin: Secondary | ICD-10-CM | POA: Diagnosis not present

## 2016-12-31 DIAGNOSIS — G893 Neoplasm related pain (acute) (chronic): Secondary | ICD-10-CM | POA: Diagnosis not present

## 2017-01-13 DIAGNOSIS — M19011 Primary osteoarthritis, right shoulder: Secondary | ICD-10-CM | POA: Diagnosis not present

## 2017-01-13 DIAGNOSIS — M25511 Pain in right shoulder: Secondary | ICD-10-CM | POA: Diagnosis not present

## 2017-01-13 DIAGNOSIS — G8929 Other chronic pain: Secondary | ICD-10-CM | POA: Diagnosis not present

## 2017-01-13 DIAGNOSIS — G2589 Other specified extrapyramidal and movement disorders: Secondary | ICD-10-CM | POA: Diagnosis not present

## 2017-01-25 DIAGNOSIS — M792 Neuralgia and neuritis, unspecified: Secondary | ICD-10-CM | POA: Diagnosis not present

## 2017-01-25 DIAGNOSIS — K5903 Drug induced constipation: Secondary | ICD-10-CM | POA: Diagnosis not present

## 2017-01-25 DIAGNOSIS — C799 Secondary malignant neoplasm of unspecified site: Secondary | ICD-10-CM | POA: Diagnosis not present

## 2017-01-25 DIAGNOSIS — Z515 Encounter for palliative care: Secondary | ICD-10-CM | POA: Diagnosis not present

## 2017-01-25 DIAGNOSIS — G893 Neoplasm related pain (acute) (chronic): Secondary | ICD-10-CM | POA: Diagnosis not present

## 2017-01-25 DIAGNOSIS — F419 Anxiety disorder, unspecified: Secondary | ICD-10-CM | POA: Diagnosis not present

## 2017-01-25 DIAGNOSIS — T402X5A Adverse effect of other opioids, initial encounter: Secondary | ICD-10-CM | POA: Diagnosis not present

## 2017-01-25 DIAGNOSIS — C7951 Secondary malignant neoplasm of bone: Secondary | ICD-10-CM | POA: Diagnosis not present

## 2017-01-25 DIAGNOSIS — C787 Secondary malignant neoplasm of liver and intrahepatic bile duct: Secondary | ICD-10-CM | POA: Diagnosis not present

## 2017-01-25 DIAGNOSIS — M25511 Pain in right shoulder: Secondary | ICD-10-CM | POA: Diagnosis not present

## 2017-01-25 DIAGNOSIS — C7931 Secondary malignant neoplasm of brain: Secondary | ICD-10-CM | POA: Diagnosis not present

## 2017-01-25 DIAGNOSIS — R110 Nausea: Secondary | ICD-10-CM | POA: Diagnosis not present

## 2017-01-25 DIAGNOSIS — G2589 Other specified extrapyramidal and movement disorders: Secondary | ICD-10-CM | POA: Diagnosis not present

## 2017-01-25 DIAGNOSIS — F329 Major depressive disorder, single episode, unspecified: Secondary | ICD-10-CM | POA: Diagnosis not present

## 2017-01-26 DIAGNOSIS — M531 Cervicobrachial syndrome: Secondary | ICD-10-CM | POA: Diagnosis not present

## 2017-01-26 DIAGNOSIS — M9901 Segmental and somatic dysfunction of cervical region: Secondary | ICD-10-CM | POA: Diagnosis not present

## 2017-01-26 DIAGNOSIS — M47812 Spondylosis without myelopathy or radiculopathy, cervical region: Secondary | ICD-10-CM | POA: Diagnosis not present

## 2017-01-28 DIAGNOSIS — M47812 Spondylosis without myelopathy or radiculopathy, cervical region: Secondary | ICD-10-CM | POA: Diagnosis not present

## 2017-01-28 DIAGNOSIS — M531 Cervicobrachial syndrome: Secondary | ICD-10-CM | POA: Diagnosis not present

## 2017-01-28 DIAGNOSIS — M9901 Segmental and somatic dysfunction of cervical region: Secondary | ICD-10-CM | POA: Diagnosis not present

## 2017-02-02 DIAGNOSIS — M9901 Segmental and somatic dysfunction of cervical region: Secondary | ICD-10-CM | POA: Diagnosis not present

## 2017-02-02 DIAGNOSIS — M47812 Spondylosis without myelopathy or radiculopathy, cervical region: Secondary | ICD-10-CM | POA: Diagnosis not present

## 2017-02-02 DIAGNOSIS — M531 Cervicobrachial syndrome: Secondary | ICD-10-CM | POA: Diagnosis not present

## 2017-02-09 DIAGNOSIS — M531 Cervicobrachial syndrome: Secondary | ICD-10-CM | POA: Diagnosis not present

## 2017-02-09 DIAGNOSIS — M47812 Spondylosis without myelopathy or radiculopathy, cervical region: Secondary | ICD-10-CM | POA: Diagnosis not present

## 2017-02-09 DIAGNOSIS — M9901 Segmental and somatic dysfunction of cervical region: Secondary | ICD-10-CM | POA: Diagnosis not present

## 2017-02-15 DIAGNOSIS — M531 Cervicobrachial syndrome: Secondary | ICD-10-CM | POA: Diagnosis not present

## 2017-02-15 DIAGNOSIS — M9901 Segmental and somatic dysfunction of cervical region: Secondary | ICD-10-CM | POA: Diagnosis not present

## 2017-02-15 DIAGNOSIS — M47812 Spondylosis without myelopathy or radiculopathy, cervical region: Secondary | ICD-10-CM | POA: Diagnosis not present

## 2017-02-21 DIAGNOSIS — M531 Cervicobrachial syndrome: Secondary | ICD-10-CM | POA: Diagnosis not present

## 2017-02-21 DIAGNOSIS — M47812 Spondylosis without myelopathy or radiculopathy, cervical region: Secondary | ICD-10-CM | POA: Diagnosis not present

## 2017-02-21 DIAGNOSIS — M9901 Segmental and somatic dysfunction of cervical region: Secondary | ICD-10-CM | POA: Diagnosis not present

## 2017-02-22 DIAGNOSIS — X58XXXA Exposure to other specified factors, initial encounter: Secondary | ICD-10-CM | POA: Diagnosis not present

## 2017-02-22 DIAGNOSIS — G4700 Insomnia, unspecified: Secondary | ICD-10-CM | POA: Diagnosis not present

## 2017-02-22 DIAGNOSIS — C439 Malignant melanoma of skin, unspecified: Secondary | ICD-10-CM | POA: Diagnosis not present

## 2017-02-22 DIAGNOSIS — R112 Nausea with vomiting, unspecified: Secondary | ICD-10-CM | POA: Diagnosis not present

## 2017-02-22 DIAGNOSIS — F329 Major depressive disorder, single episode, unspecified: Secondary | ICD-10-CM | POA: Diagnosis not present

## 2017-02-22 DIAGNOSIS — S4991XA Unspecified injury of right shoulder and upper arm, initial encounter: Secondary | ICD-10-CM | POA: Diagnosis not present

## 2017-02-22 DIAGNOSIS — F419 Anxiety disorder, unspecified: Secondary | ICD-10-CM | POA: Diagnosis not present

## 2017-02-22 DIAGNOSIS — C7951 Secondary malignant neoplasm of bone: Secondary | ICD-10-CM | POA: Diagnosis not present

## 2017-02-22 DIAGNOSIS — M792 Neuralgia and neuritis, unspecified: Secondary | ICD-10-CM | POA: Diagnosis not present

## 2017-02-22 DIAGNOSIS — Z515 Encounter for palliative care: Secondary | ICD-10-CM | POA: Diagnosis not present

## 2017-02-22 DIAGNOSIS — R451 Restlessness and agitation: Secondary | ICD-10-CM | POA: Diagnosis not present

## 2017-02-22 DIAGNOSIS — R110 Nausea: Secondary | ICD-10-CM | POA: Diagnosis not present

## 2017-02-22 DIAGNOSIS — M62838 Other muscle spasm: Secondary | ICD-10-CM | POA: Diagnosis not present

## 2017-02-22 DIAGNOSIS — C799 Secondary malignant neoplasm of unspecified site: Secondary | ICD-10-CM | POA: Diagnosis not present

## 2017-02-22 DIAGNOSIS — R5383 Other fatigue: Secondary | ICD-10-CM | POA: Diagnosis not present

## 2017-02-22 DIAGNOSIS — C7931 Secondary malignant neoplasm of brain: Secondary | ICD-10-CM | POA: Diagnosis not present

## 2017-02-22 DIAGNOSIS — G9619 Other disorders of meninges, not elsewhere classified: Secondary | ICD-10-CM | POA: Diagnosis not present

## 2017-02-22 DIAGNOSIS — G893 Neoplasm related pain (acute) (chronic): Secondary | ICD-10-CM | POA: Diagnosis not present

## 2017-02-24 DIAGNOSIS — M75121 Complete rotator cuff tear or rupture of right shoulder, not specified as traumatic: Secondary | ICD-10-CM | POA: Diagnosis not present

## 2017-02-24 DIAGNOSIS — M75101 Unspecified rotator cuff tear or rupture of right shoulder, not specified as traumatic: Secondary | ICD-10-CM | POA: Diagnosis not present

## 2017-02-25 DIAGNOSIS — M531 Cervicobrachial syndrome: Secondary | ICD-10-CM | POA: Diagnosis not present

## 2017-02-25 DIAGNOSIS — M9901 Segmental and somatic dysfunction of cervical region: Secondary | ICD-10-CM | POA: Diagnosis not present

## 2017-02-25 DIAGNOSIS — M47812 Spondylosis without myelopathy or radiculopathy, cervical region: Secondary | ICD-10-CM | POA: Diagnosis not present

## 2017-03-18 DIAGNOSIS — C434 Malignant melanoma of scalp and neck: Secondary | ICD-10-CM | POA: Diagnosis not present

## 2017-03-18 DIAGNOSIS — C7931 Secondary malignant neoplasm of brain: Secondary | ICD-10-CM | POA: Diagnosis not present

## 2017-03-25 DIAGNOSIS — C7989 Secondary malignant neoplasm of other specified sites: Secondary | ICD-10-CM | POA: Diagnosis not present

## 2017-03-25 DIAGNOSIS — C7972 Secondary malignant neoplasm of left adrenal gland: Secondary | ICD-10-CM | POA: Diagnosis not present

## 2017-03-25 DIAGNOSIS — C7951 Secondary malignant neoplasm of bone: Secondary | ICD-10-CM | POA: Diagnosis not present

## 2017-03-25 DIAGNOSIS — C799 Secondary malignant neoplasm of unspecified site: Secondary | ICD-10-CM | POA: Diagnosis not present

## 2017-03-25 DIAGNOSIS — C787 Secondary malignant neoplasm of liver and intrahepatic bile duct: Secondary | ICD-10-CM | POA: Diagnosis not present

## 2017-03-25 DIAGNOSIS — C7931 Secondary malignant neoplasm of brain: Secondary | ICD-10-CM | POA: Diagnosis not present

## 2017-04-22 DIAGNOSIS — M25519 Pain in unspecified shoulder: Secondary | ICD-10-CM | POA: Diagnosis not present

## 2017-04-22 DIAGNOSIS — C7931 Secondary malignant neoplasm of brain: Secondary | ICD-10-CM | POA: Diagnosis not present

## 2017-04-22 DIAGNOSIS — R252 Cramp and spasm: Secondary | ICD-10-CM | POA: Diagnosis not present

## 2017-04-22 DIAGNOSIS — C7951 Secondary malignant neoplasm of bone: Secondary | ICD-10-CM | POA: Diagnosis not present

## 2017-04-22 DIAGNOSIS — Z85820 Personal history of malignant melanoma of skin: Secondary | ICD-10-CM | POA: Diagnosis not present

## 2017-04-27 DIAGNOSIS — C799 Secondary malignant neoplasm of unspecified site: Secondary | ICD-10-CM | POA: Diagnosis not present

## 2017-04-27 DIAGNOSIS — Z9889 Other specified postprocedural states: Secondary | ICD-10-CM | POA: Diagnosis not present

## 2017-04-27 DIAGNOSIS — C7972 Secondary malignant neoplasm of left adrenal gland: Secondary | ICD-10-CM | POA: Diagnosis not present

## 2017-04-27 DIAGNOSIS — R634 Abnormal weight loss: Secondary | ICD-10-CM | POA: Diagnosis not present

## 2017-04-27 DIAGNOSIS — C7951 Secondary malignant neoplasm of bone: Secondary | ICD-10-CM | POA: Diagnosis not present

## 2017-04-27 DIAGNOSIS — C7931 Secondary malignant neoplasm of brain: Secondary | ICD-10-CM | POA: Diagnosis not present

## 2017-05-24 DIAGNOSIS — M62838 Other muscle spasm: Secondary | ICD-10-CM | POA: Diagnosis not present

## 2017-05-24 DIAGNOSIS — Q2549 Other congenital malformations of aorta: Secondary | ICD-10-CM | POA: Diagnosis not present

## 2017-05-24 DIAGNOSIS — Z9049 Acquired absence of other specified parts of digestive tract: Secondary | ICD-10-CM | POA: Diagnosis not present

## 2017-05-24 DIAGNOSIS — Z515 Encounter for palliative care: Secondary | ICD-10-CM | POA: Diagnosis not present

## 2017-05-24 DIAGNOSIS — K769 Liver disease, unspecified: Secondary | ICD-10-CM | POA: Diagnosis not present

## 2017-05-24 DIAGNOSIS — I700 Atherosclerosis of aorta: Secondary | ICD-10-CM | POA: Diagnosis not present

## 2017-05-24 DIAGNOSIS — C7931 Secondary malignant neoplasm of brain: Secondary | ICD-10-CM | POA: Diagnosis not present

## 2017-05-24 DIAGNOSIS — C7951 Secondary malignant neoplasm of bone: Secondary | ICD-10-CM | POA: Diagnosis not present

## 2017-05-24 DIAGNOSIS — E279 Disorder of adrenal gland, unspecified: Secondary | ICD-10-CM | POA: Diagnosis not present

## 2017-05-24 DIAGNOSIS — J432 Centrilobular emphysema: Secondary | ICD-10-CM | POA: Diagnosis not present

## 2017-05-24 DIAGNOSIS — K449 Diaphragmatic hernia without obstruction or gangrene: Secondary | ICD-10-CM | POA: Diagnosis not present

## 2017-05-24 DIAGNOSIS — Z87891 Personal history of nicotine dependence: Secondary | ICD-10-CM | POA: Diagnosis not present

## 2017-05-24 DIAGNOSIS — C799 Secondary malignant neoplasm of unspecified site: Secondary | ICD-10-CM | POA: Diagnosis not present

## 2017-06-29 DIAGNOSIS — C799 Secondary malignant neoplasm of unspecified site: Secondary | ICD-10-CM | POA: Diagnosis not present

## 2017-06-29 DIAGNOSIS — Z79899 Other long term (current) drug therapy: Secondary | ICD-10-CM | POA: Diagnosis not present

## 2017-06-29 DIAGNOSIS — G8929 Other chronic pain: Secondary | ICD-10-CM | POA: Diagnosis not present

## 2017-06-29 DIAGNOSIS — Z85828 Personal history of other malignant neoplasm of skin: Secondary | ICD-10-CM | POA: Diagnosis not present

## 2017-06-29 DIAGNOSIS — C7931 Secondary malignant neoplasm of brain: Secondary | ICD-10-CM | POA: Diagnosis not present

## 2017-06-29 DIAGNOSIS — C7951 Secondary malignant neoplasm of bone: Secondary | ICD-10-CM | POA: Diagnosis not present

## 2017-06-29 DIAGNOSIS — C787 Secondary malignant neoplasm of liver and intrahepatic bile duct: Secondary | ICD-10-CM | POA: Diagnosis not present

## 2017-07-05 DIAGNOSIS — M160 Bilateral primary osteoarthritis of hip: Secondary | ICD-10-CM | POA: Diagnosis not present

## 2017-07-05 DIAGNOSIS — M5136 Other intervertebral disc degeneration, lumbar region: Secondary | ICD-10-CM | POA: Diagnosis not present

## 2017-07-05 DIAGNOSIS — C7951 Secondary malignant neoplasm of bone: Secondary | ICD-10-CM | POA: Diagnosis not present

## 2017-07-05 DIAGNOSIS — C9000 Multiple myeloma not having achieved remission: Secondary | ICD-10-CM | POA: Diagnosis not present

## 2017-07-05 DIAGNOSIS — M533 Sacrococcygeal disorders, not elsewhere classified: Secondary | ICD-10-CM | POA: Diagnosis not present

## 2017-07-19 DIAGNOSIS — C439 Malignant melanoma of skin, unspecified: Secondary | ICD-10-CM | POA: Diagnosis not present

## 2017-07-19 DIAGNOSIS — C7931 Secondary malignant neoplasm of brain: Secondary | ICD-10-CM | POA: Diagnosis not present

## 2017-07-19 DIAGNOSIS — Z510 Encounter for antineoplastic radiation therapy: Secondary | ICD-10-CM | POA: Diagnosis not present

## 2017-07-26 DIAGNOSIS — C799 Secondary malignant neoplasm of unspecified site: Secondary | ICD-10-CM | POA: Diagnosis not present

## 2017-07-26 DIAGNOSIS — F329 Major depressive disorder, single episode, unspecified: Secondary | ICD-10-CM | POA: Diagnosis not present

## 2017-07-26 DIAGNOSIS — C7931 Secondary malignant neoplasm of brain: Secondary | ICD-10-CM | POA: Diagnosis not present

## 2017-07-26 DIAGNOSIS — Z79899 Other long term (current) drug therapy: Secondary | ICD-10-CM | POA: Diagnosis not present

## 2017-07-26 DIAGNOSIS — Z87891 Personal history of nicotine dependence: Secondary | ICD-10-CM | POA: Diagnosis not present

## 2017-07-26 DIAGNOSIS — G893 Neoplasm related pain (acute) (chronic): Secondary | ICD-10-CM | POA: Diagnosis not present

## 2017-07-26 DIAGNOSIS — F419 Anxiety disorder, unspecified: Secondary | ICD-10-CM | POA: Diagnosis not present

## 2017-07-26 DIAGNOSIS — C787 Secondary malignant neoplasm of liver and intrahepatic bile duct: Secondary | ICD-10-CM | POA: Diagnosis not present

## 2017-07-26 DIAGNOSIS — Z515 Encounter for palliative care: Secondary | ICD-10-CM | POA: Diagnosis not present

## 2017-07-26 DIAGNOSIS — C7951 Secondary malignant neoplasm of bone: Secondary | ICD-10-CM | POA: Diagnosis not present

## 2017-09-23 DIAGNOSIS — C7931 Secondary malignant neoplasm of brain: Secondary | ICD-10-CM | POA: Diagnosis not present

## 2017-10-07 DIAGNOSIS — M47814 Spondylosis without myelopathy or radiculopathy, thoracic region: Secondary | ICD-10-CM | POA: Diagnosis not present

## 2017-10-07 DIAGNOSIS — M47812 Spondylosis without myelopathy or radiculopathy, cervical region: Secondary | ICD-10-CM | POA: Diagnosis not present

## 2017-10-07 DIAGNOSIS — M5126 Other intervertebral disc displacement, lumbar region: Secondary | ICD-10-CM | POA: Diagnosis not present

## 2017-10-07 DIAGNOSIS — R29898 Other symptoms and signs involving the musculoskeletal system: Secondary | ICD-10-CM | POA: Diagnosis not present

## 2017-11-04 DIAGNOSIS — T66XXXA Radiation sickness, unspecified, initial encounter: Secondary | ICD-10-CM | POA: Diagnosis not present

## 2017-11-04 DIAGNOSIS — C7931 Secondary malignant neoplasm of brain: Secondary | ICD-10-CM | POA: Diagnosis not present

## 2017-11-04 DIAGNOSIS — C7951 Secondary malignant neoplasm of bone: Secondary | ICD-10-CM | POA: Diagnosis not present

## 2017-11-04 DIAGNOSIS — C799 Secondary malignant neoplasm of unspecified site: Secondary | ICD-10-CM | POA: Diagnosis not present

## 2017-11-04 DIAGNOSIS — G8929 Other chronic pain: Secondary | ICD-10-CM | POA: Diagnosis not present

## 2017-11-04 DIAGNOSIS — C439 Malignant melanoma of skin, unspecified: Secondary | ICD-10-CM | POA: Diagnosis not present

## 2017-12-27 DIAGNOSIS — Z9889 Other specified postprocedural states: Secondary | ICD-10-CM | POA: Diagnosis not present

## 2017-12-27 DIAGNOSIS — R110 Nausea: Secondary | ICD-10-CM | POA: Diagnosis not present

## 2017-12-27 DIAGNOSIS — C439 Malignant melanoma of skin, unspecified: Secondary | ICD-10-CM | POA: Diagnosis not present

## 2017-12-27 DIAGNOSIS — R634 Abnormal weight loss: Secondary | ICD-10-CM | POA: Diagnosis not present

## 2017-12-27 DIAGNOSIS — F419 Anxiety disorder, unspecified: Secondary | ICD-10-CM | POA: Diagnosis not present

## 2017-12-27 DIAGNOSIS — M25552 Pain in left hip: Secondary | ICD-10-CM | POA: Diagnosis not present

## 2017-12-27 DIAGNOSIS — Z923 Personal history of irradiation: Secondary | ICD-10-CM | POA: Diagnosis not present

## 2017-12-27 DIAGNOSIS — C779 Secondary and unspecified malignant neoplasm of lymph node, unspecified: Secondary | ICD-10-CM | POA: Diagnosis not present

## 2017-12-27 DIAGNOSIS — C7931 Secondary malignant neoplasm of brain: Secondary | ICD-10-CM | POA: Diagnosis not present

## 2017-12-27 DIAGNOSIS — C787 Secondary malignant neoplasm of liver and intrahepatic bile duct: Secondary | ICD-10-CM | POA: Diagnosis not present

## 2017-12-27 DIAGNOSIS — K219 Gastro-esophageal reflux disease without esophagitis: Secondary | ICD-10-CM | POA: Diagnosis not present

## 2017-12-27 DIAGNOSIS — G893 Neoplasm related pain (acute) (chronic): Secondary | ICD-10-CM | POA: Diagnosis not present

## 2017-12-27 DIAGNOSIS — C7971 Secondary malignant neoplasm of right adrenal gland: Secondary | ICD-10-CM | POA: Diagnosis not present

## 2017-12-27 DIAGNOSIS — C7972 Secondary malignant neoplasm of left adrenal gland: Secondary | ICD-10-CM | POA: Diagnosis not present

## 2017-12-27 DIAGNOSIS — Z886 Allergy status to analgesic agent status: Secondary | ICD-10-CM | POA: Diagnosis not present

## 2017-12-27 DIAGNOSIS — G4710 Hypersomnia, unspecified: Secondary | ICD-10-CM | POA: Diagnosis not present

## 2017-12-27 DIAGNOSIS — Z885 Allergy status to narcotic agent status: Secondary | ICD-10-CM | POA: Diagnosis not present

## 2017-12-27 DIAGNOSIS — C7949 Secondary malignant neoplasm of other parts of nervous system: Secondary | ICD-10-CM | POA: Diagnosis not present

## 2017-12-27 DIAGNOSIS — Z888 Allergy status to other drugs, medicaments and biological substances status: Secondary | ICD-10-CM | POA: Diagnosis not present

## 2017-12-27 DIAGNOSIS — Z79899 Other long term (current) drug therapy: Secondary | ICD-10-CM | POA: Diagnosis not present

## 2017-12-27 DIAGNOSIS — M25511 Pain in right shoulder: Secondary | ICD-10-CM | POA: Diagnosis not present

## 2017-12-27 DIAGNOSIS — Z515 Encounter for palliative care: Secondary | ICD-10-CM | POA: Diagnosis not present

## 2017-12-27 DIAGNOSIS — C7951 Secondary malignant neoplasm of bone: Secondary | ICD-10-CM | POA: Diagnosis not present

## 2017-12-27 DIAGNOSIS — F329 Major depressive disorder, single episode, unspecified: Secondary | ICD-10-CM | POA: Diagnosis not present

## 2018-01-03 DIAGNOSIS — C7951 Secondary malignant neoplasm of bone: Secondary | ICD-10-CM | POA: Diagnosis not present

## 2018-01-13 DIAGNOSIS — C434 Malignant melanoma of scalp and neck: Secondary | ICD-10-CM | POA: Diagnosis not present

## 2018-01-13 DIAGNOSIS — C7931 Secondary malignant neoplasm of brain: Secondary | ICD-10-CM | POA: Diagnosis not present

## 2018-01-13 DIAGNOSIS — R200 Anesthesia of skin: Secondary | ICD-10-CM | POA: Diagnosis not present

## 2018-01-20 DIAGNOSIS — C7931 Secondary malignant neoplasm of brain: Secondary | ICD-10-CM | POA: Diagnosis not present

## 2018-01-20 DIAGNOSIS — C7951 Secondary malignant neoplasm of bone: Secondary | ICD-10-CM | POA: Diagnosis not present

## 2018-01-20 DIAGNOSIS — C799 Secondary malignant neoplasm of unspecified site: Secondary | ICD-10-CM | POA: Diagnosis not present

## 2018-01-20 DIAGNOSIS — C434 Malignant melanoma of scalp and neck: Secondary | ICD-10-CM | POA: Diagnosis not present

## 2018-01-20 DIAGNOSIS — K7689 Other specified diseases of liver: Secondary | ICD-10-CM | POA: Diagnosis not present

## 2018-01-20 DIAGNOSIS — C439 Malignant melanoma of skin, unspecified: Secondary | ICD-10-CM | POA: Diagnosis not present

## 2018-02-22 DIAGNOSIS — Z79899 Other long term (current) drug therapy: Secondary | ICD-10-CM | POA: Diagnosis not present

## 2018-02-22 DIAGNOSIS — F329 Major depressive disorder, single episode, unspecified: Secondary | ICD-10-CM | POA: Diagnosis not present

## 2018-02-22 DIAGNOSIS — M792 Neuralgia and neuritis, unspecified: Secondary | ICD-10-CM | POA: Diagnosis not present

## 2018-02-22 DIAGNOSIS — Z5181 Encounter for therapeutic drug level monitoring: Secondary | ICD-10-CM | POA: Diagnosis not present

## 2018-02-22 DIAGNOSIS — M898X1 Other specified disorders of bone, shoulder: Secondary | ICD-10-CM | POA: Diagnosis not present

## 2018-02-22 DIAGNOSIS — G893 Neoplasm related pain (acute) (chronic): Secondary | ICD-10-CM | POA: Diagnosis not present

## 2018-02-28 DIAGNOSIS — Z515 Encounter for palliative care: Secondary | ICD-10-CM | POA: Diagnosis not present

## 2018-02-28 DIAGNOSIS — C7931 Secondary malignant neoplasm of brain: Secondary | ICD-10-CM | POA: Diagnosis not present

## 2018-02-28 DIAGNOSIS — Z886 Allergy status to analgesic agent status: Secondary | ICD-10-CM | POA: Diagnosis not present

## 2018-02-28 DIAGNOSIS — Z885 Allergy status to narcotic agent status: Secondary | ICD-10-CM | POA: Diagnosis not present

## 2018-02-28 DIAGNOSIS — M25552 Pain in left hip: Secondary | ICD-10-CM | POA: Diagnosis not present

## 2018-02-28 DIAGNOSIS — F331 Major depressive disorder, recurrent, moderate: Secondary | ICD-10-CM | POA: Diagnosis not present

## 2018-02-28 DIAGNOSIS — Z888 Allergy status to other drugs, medicaments and biological substances status: Secondary | ICD-10-CM | POA: Diagnosis not present

## 2018-02-28 DIAGNOSIS — C7971 Secondary malignant neoplasm of right adrenal gland: Secondary | ICD-10-CM | POA: Diagnosis not present

## 2018-02-28 DIAGNOSIS — F411 Generalized anxiety disorder: Secondary | ICD-10-CM | POA: Diagnosis not present

## 2018-02-28 DIAGNOSIS — Z8619 Personal history of other infectious and parasitic diseases: Secondary | ICD-10-CM | POA: Diagnosis not present

## 2018-02-28 DIAGNOSIS — C7949 Secondary malignant neoplasm of other parts of nervous system: Secondary | ICD-10-CM | POA: Diagnosis not present

## 2018-02-28 DIAGNOSIS — G893 Neoplasm related pain (acute) (chronic): Secondary | ICD-10-CM | POA: Diagnosis not present

## 2018-02-28 DIAGNOSIS — C779 Secondary and unspecified malignant neoplasm of lymph node, unspecified: Secondary | ICD-10-CM | POA: Diagnosis not present

## 2018-02-28 DIAGNOSIS — C7951 Secondary malignant neoplasm of bone: Secondary | ICD-10-CM | POA: Diagnosis not present

## 2018-02-28 DIAGNOSIS — C439 Malignant melanoma of skin, unspecified: Secondary | ICD-10-CM | POA: Diagnosis not present

## 2018-02-28 DIAGNOSIS — Z9889 Other specified postprocedural states: Secondary | ICD-10-CM | POA: Diagnosis not present

## 2018-02-28 DIAGNOSIS — F329 Major depressive disorder, single episode, unspecified: Secondary | ICD-10-CM | POA: Diagnosis not present

## 2018-02-28 DIAGNOSIS — C7972 Secondary malignant neoplasm of left adrenal gland: Secondary | ICD-10-CM | POA: Diagnosis not present

## 2018-02-28 DIAGNOSIS — Z79899 Other long term (current) drug therapy: Secondary | ICD-10-CM | POA: Diagnosis not present

## 2018-02-28 DIAGNOSIS — C787 Secondary malignant neoplasm of liver and intrahepatic bile duct: Secondary | ICD-10-CM | POA: Diagnosis not present

## 2018-02-28 DIAGNOSIS — G629 Polyneuropathy, unspecified: Secondary | ICD-10-CM | POA: Diagnosis not present

## 2018-02-28 DIAGNOSIS — R110 Nausea: Secondary | ICD-10-CM | POA: Diagnosis not present

## 2018-03-09 DIAGNOSIS — C7931 Secondary malignant neoplasm of brain: Secondary | ICD-10-CM | POA: Diagnosis not present

## 2018-05-01 DIAGNOSIS — C7931 Secondary malignant neoplasm of brain: Secondary | ICD-10-CM | POA: Diagnosis not present

## 2018-05-01 DIAGNOSIS — C439 Malignant melanoma of skin, unspecified: Secondary | ICD-10-CM | POA: Diagnosis not present

## 2018-05-01 DIAGNOSIS — Z9889 Other specified postprocedural states: Secondary | ICD-10-CM | POA: Diagnosis not present

## 2018-05-02 NOTE — Progress Notes (Deleted)
Psychiatric Initial Adult Assessment   Patient Identification: Theodore Gordon MRN:  [id number] Date of Evaluation:  05/02/2018 Referral Source: *** Chief Complaint:   Visit Diagnosis: No diagnosis found.  History of Present Illness:   Theodore Gordon is a 62 y.o. year old male with a history of depression, stage IV melanoma with metastasis to bone, brain, liver adrenals, LN and leptomenigeal involvement (originally diagnosed in 2011) s/p gamma knife radiosurgery, prembrolizumab, who is referred for depression.    Theodore Gordon He underwent surgical removaland right neck lymphadenectomy. 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 In September 2016 he underwent 21 instances of gamma knife radiosurgery and received pembrolizumab. He also had radiation toleft hip. November 2016,CT showed some response to treatment. Brain MRI showed multiple bilateral metastatic lesions. In Novemeber 2018, had gamma knife radiosurgery to brain. [facility] was consulted to help with symptom management. Patient is here for follow up.  Recommendations:  #Pain:  -uncontrolled. He is currently on oxycodone 20mg  po Q4 prn and taking 5-6 pills per day. Prior he was on long acting but didn't feel it helped. Prior he has tried fentanyl patch, tramadol, NSAIDs, dilaudid, marinol, TCA (amitripytline), Neostigmine, oxycontin, oxycodone, zohydro, morphine IR/ER, gabapentin, lyrica, cymbalta, keppra, Butrans, Venlafaxine, multiple different muscle relaxants (baclofen, tizanidine,cyclobenzaprine, robaxin/methocarbamol) , lidocaine, and methadone without benefit. 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 He saw Johnny Bridge with [facility] and s/p Right Suprascapular Nerve Block and right thoracic and neck trigger point injections on 05/2016. We sent him back there to look  into possible RFA vs stimulator, Dr. Wynetta Emery also talked to him about possible repeat nerve block but patient currently not interested. -we discussed that we could do a trial again of long acting pain medications at night but not interested so will c/w oxycodone 20mg  po Q4 prn.   #Uncontrolled Depression and GAD -patient has tried TCAs, mirtazapine, cymbalta, venlafaxine, ritalin, ativan, buspar, valium, clonazapam in the past.  -rec'd starting Zoloft, patient stopped venlafaxine on his own so don't need to titrate off. -rec'd counseling which the patient is in agreement so referral placed.     Associated Signs/Symptoms: Depression Symptoms:  {DEPRESSION SYMPTOMS:20000} (Hypo) Manic Symptoms:  {BHH MANIC SYMPTOMS:22872} Anxiety Symptoms:  {BHH ANXIETY SYMPTOMS:22873} Psychotic Symptoms:  {BHH PSYCHOTIC SYMPTOMS:22874} PTSD Symptoms: {BHH PTSD SYMPTOMS:22875}  Past Psychiatric History:  Outpatient:  Psychiatry admission:  Previous suicide attempt:  Past trials of medication: sertraline, cymbalta, venlafaxine, mirtazapine, buspar,TCAs,  ritalin, ativan, valium, clonazapam History of violence:   Previous Psychotropic Medications: {YES/NO:21197}  Substance Abuse History in the last 12 months:  {yes no:314532}  Consequences of Substance Abuse: {BHH CONSEQUENCES OF SUBSTANCE ABUSE:22880}  Past Medical History:  Past Medical History:  Diagnosis Date  . Chronic pain   . Melanoma    No past surgical history on file.  Family Psychiatric History: ***  Family History: No family history on file.  Social History:   Social History   Socioeconomic History  . Marital status: Divorced    Spouse name: Not on file  . Number of children: Not on file  . Years of education: Not on file  . Highest education level: Not on file  Occupational History  . Not on file  Social Needs  . Financial resource strain: Not on file  . Food insecurity:    Worry: Not on file    Inability: Not on  file  . Transportation needs:    Medical: Not on file    Non-medical: Not on file  Tobacco Use  . Smoking status: Former Smoker  Substance and Sexual Activity  . Alcohol use: No  . Drug use: No  . Sexual activity: Not on file  Lifestyle  . Physical activity:    Days per week: Not on file    Minutes per session: Not on file  . Stress: Not on file  Relationships  . Social connections:    Talks on phone: Not on file    Gets together: Not on file    Attends religious service: Not on file    Active member of club or organization: Not on file    Attends meetings of clubs or organizations: Not on file    Relationship status: Not on file  Other Topics Concern  . Not on file  Social History Narrative  . Not on file    Additional Social History: ***  Allergies:   Allergies  Allergen Reactions  . Codeine Nausea And Vomiting    Metabolic Disorder Labs: No results found for: HGBA1C, MPG No results found for: PROLACTIN No results found for: CHOL, TRIG, HDL, CHOLHDL, VLDL, LDLCALC   Current Medications: Current Outpatient Medications  Medication Sig Dispense Refill  . diazepam (VALIUM) 10 MG tablet Take 10 mg by mouth every 6 (six) hours as needed. For anxiety    . DULoxetine (CYMBALTA) 30 MG capsule Take 30 mg by mouth daily.      . Flaxseed, Linseed, (FLAX SEED OIL PO) Take 2 tablets by mouth 2 (two) times daily. In the morning and at bedtime     . ibuprofen (ADVIL,MOTRIN) 200 MG tablet Take 200-600 mg by mouth as needed. For pain alternating with Aleve (Naproxen 220mg ) every 4 hours     . MIRTAZAPINE PO Take 1 tablet by mouth at bedtime. For sleep     . Multiple Vitamins-Minerals (CENTRUM ULTRA MENS) TABS Take 1 tablet by mouth daily.      . naproxen sodium (ANAPROX) 220 MG tablet Take 440-660 mg by mouth as needed. For pain alternating with Advil (Ibuprofen 200mg ) every 4 hours     . OVER THE COUNTER MEDICATION Take 1-2 tablets by mouth daily. OTC: AGELESS MALE-testosterone  health supplement     . oxycodone (ROXICODONE) 30 MG immediate release tablet Take 30 mg by mouth every 4 (four) hours as needed. For pain    . TESTOSTERONE IM Inject 1 each into the muscle every 14 (fourteen) days. Per physician administration     . tetrahydrozoline-zinc (VISINE-AC) 0.05-0.25 % ophthalmic solution Place 1 drop into both eyes daily as needed. For dry eye relief      No current facility-administered medications for this visit.     Neurologic: Headache: No Seizure: No Paresthesias:No  Musculoskeletal: Strength & Muscle Tone: within normal limits Gait & Station: normal Patient leans: N/A  Psychiatric Specialty Exam: ROS  There were no vitals taken for this visit.There is no height or weight on file to calculate BMI.  General Appearance: Fairly Groomed  Eye Contact:  Good  Speech:  Clear and Coherent  Volume:  Normal  Mood:  {BHH MOOD:22306}  Affect:  {Affect (PAA):22687}  Thought Process:  Coherent  Orientation:  Full (Time, Place, and Person)  Thought Content:  Logical  Suicidal Thoughts:  {ST/HT (PAA):22692}  Homicidal Thoughts:  {ST/HT (PAA):22692}  Memory:  Immediate;   Good  Judgement:  {Judgement (PAA):22694}  Insight:  {Insight (  PAA):22695}  Psychomotor Activity:  Normal  Concentration:  Concentration: Good and Attention Span: Good  Recall:  Good  Fund of Knowledge:Good  Language: Good  Akathisia:  No  Handed:  Right  AIMS (if indicated):  N/A  Assets:  Communication Skills Desire for Improvement  ADL's:  Intact  Cognition: WNL  Sleep:  ***   Assessment  Plan  The patient demonstrates the following risk factors for suicide: Chronic risk factors for suicide include: {Chronic Risk Factors for GYBNLWH:87183672}. Acute risk factors for suicide include: {Acute Risk Factors for VHQITUY:29037955}. Protective factors for this patient include: {Protective Factors for Suicide OPRA:74255258}. Considering these factors, the overall suicide risk at this  point appears to be {Desc; low/moderate/high:110033}. Patient {ACTION; IS/IS FUQ:34758307} appropriate for outpatient follow up.   Treatment Plan Summary: Plan as above   Norman Clay, MD 8/20/201910:03 AM

## 2018-05-04 ENCOUNTER — Ambulatory Visit (HOSPITAL_COMMUNITY): Payer: Medicare Other | Admitting: Psychiatry

## 2018-06-05 DIAGNOSIS — Z515 Encounter for palliative care: Secondary | ICD-10-CM | POA: Diagnosis not present

## 2018-06-05 DIAGNOSIS — F419 Anxiety disorder, unspecified: Secondary | ICD-10-CM | POA: Diagnosis not present

## 2018-06-05 DIAGNOSIS — C7951 Secondary malignant neoplasm of bone: Secondary | ICD-10-CM | POA: Diagnosis not present

## 2018-06-05 DIAGNOSIS — R530 Neoplastic (malignant) related fatigue: Secondary | ICD-10-CM | POA: Diagnosis not present

## 2018-06-05 DIAGNOSIS — G893 Neoplasm related pain (acute) (chronic): Secondary | ICD-10-CM | POA: Diagnosis not present

## 2018-06-05 DIAGNOSIS — C799 Secondary malignant neoplasm of unspecified site: Secondary | ICD-10-CM | POA: Diagnosis not present

## 2018-06-05 DIAGNOSIS — C434 Malignant melanoma of scalp and neck: Secondary | ICD-10-CM | POA: Diagnosis not present

## 2018-06-05 DIAGNOSIS — F329 Major depressive disorder, single episode, unspecified: Secondary | ICD-10-CM | POA: Diagnosis not present

## 2018-06-05 DIAGNOSIS — C787 Secondary malignant neoplasm of liver and intrahepatic bile duct: Secondary | ICD-10-CM | POA: Diagnosis not present

## 2018-06-05 DIAGNOSIS — M62838 Other muscle spasm: Secondary | ICD-10-CM | POA: Diagnosis not present

## 2018-06-05 DIAGNOSIS — C7931 Secondary malignant neoplasm of brain: Secondary | ICD-10-CM | POA: Diagnosis not present

## 2018-06-05 DIAGNOSIS — Z79899 Other long term (current) drug therapy: Secondary | ICD-10-CM | POA: Diagnosis not present

## 2018-06-07 DIAGNOSIS — C7931 Secondary malignant neoplasm of brain: Secondary | ICD-10-CM | POA: Diagnosis not present

## 2018-06-07 DIAGNOSIS — K7689 Other specified diseases of liver: Secondary | ICD-10-CM | POA: Diagnosis not present

## 2018-06-07 DIAGNOSIS — C799 Secondary malignant neoplasm of unspecified site: Secondary | ICD-10-CM | POA: Diagnosis not present

## 2018-06-07 DIAGNOSIS — E279 Disorder of adrenal gland, unspecified: Secondary | ICD-10-CM | POA: Diagnosis not present

## 2018-06-07 DIAGNOSIS — C787 Secondary malignant neoplasm of liver and intrahepatic bile duct: Secondary | ICD-10-CM | POA: Diagnosis not present

## 2018-06-07 DIAGNOSIS — C7951 Secondary malignant neoplasm of bone: Secondary | ICD-10-CM | POA: Diagnosis not present

## 2018-06-07 DIAGNOSIS — G893 Neoplasm related pain (acute) (chronic): Secondary | ICD-10-CM | POA: Diagnosis not present

## 2018-06-07 DIAGNOSIS — R911 Solitary pulmonary nodule: Secondary | ICD-10-CM | POA: Diagnosis not present

## 2018-06-07 DIAGNOSIS — Z923 Personal history of irradiation: Secondary | ICD-10-CM | POA: Diagnosis not present

## 2018-06-07 DIAGNOSIS — C434 Malignant melanoma of scalp and neck: Secondary | ICD-10-CM | POA: Diagnosis not present

## 2018-07-24 DIAGNOSIS — C7951 Secondary malignant neoplasm of bone: Secondary | ICD-10-CM | POA: Diagnosis not present

## 2018-07-24 DIAGNOSIS — C7931 Secondary malignant neoplasm of brain: Secondary | ICD-10-CM | POA: Diagnosis not present

## 2018-07-24 DIAGNOSIS — C434 Malignant melanoma of scalp and neck: Secondary | ICD-10-CM | POA: Diagnosis not present

## 2018-09-07 DIAGNOSIS — L89159 Pressure ulcer of sacral region, unspecified stage: Secondary | ICD-10-CM | POA: Diagnosis not present

## 2018-09-07 DIAGNOSIS — R4182 Altered mental status, unspecified: Secondary | ICD-10-CM | POA: Diagnosis not present

## 2018-09-07 DIAGNOSIS — C7931 Secondary malignant neoplasm of brain: Secondary | ICD-10-CM | POA: Diagnosis not present

## 2018-09-07 DIAGNOSIS — E860 Dehydration: Secondary | ICD-10-CM | POA: Diagnosis not present

## 2018-09-07 DIAGNOSIS — T68XXXA Hypothermia, initial encounter: Secondary | ICD-10-CM | POA: Diagnosis not present

## 2018-09-07 DIAGNOSIS — D72829 Elevated white blood cell count, unspecified: Secondary | ICD-10-CM | POA: Diagnosis not present

## 2018-09-07 DIAGNOSIS — E870 Hyperosmolality and hypernatremia: Secondary | ICD-10-CM | POA: Diagnosis not present

## 2018-09-07 DIAGNOSIS — N179 Acute kidney failure, unspecified: Secondary | ICD-10-CM | POA: Diagnosis not present

## 2018-09-08 DIAGNOSIS — G238 Other specified degenerative diseases of basal ganglia: Secondary | ICD-10-CM | POA: Diagnosis not present

## 2018-09-08 DIAGNOSIS — N179 Acute kidney failure, unspecified: Secondary | ICD-10-CM | POA: Diagnosis not present

## 2018-09-08 DIAGNOSIS — R4189 Other symptoms and signs involving cognitive functions and awareness: Secondary | ICD-10-CM | POA: Diagnosis not present

## 2018-09-08 DIAGNOSIS — G893 Neoplasm related pain (acute) (chronic): Secondary | ICD-10-CM | POA: Diagnosis not present

## 2018-09-08 DIAGNOSIS — G40909 Epilepsy, unspecified, not intractable, without status epilepticus: Secondary | ICD-10-CM | POA: Diagnosis not present

## 2018-09-08 DIAGNOSIS — M792 Neuralgia and neuritis, unspecified: Secondary | ICD-10-CM | POA: Diagnosis not present

## 2018-09-08 DIAGNOSIS — G9389 Other specified disorders of brain: Secondary | ICD-10-CM | POA: Diagnosis not present

## 2018-09-08 DIAGNOSIS — G894 Chronic pain syndrome: Secondary | ICD-10-CM | POA: Diagnosis not present

## 2018-09-08 DIAGNOSIS — Z79899 Other long term (current) drug therapy: Secondary | ICD-10-CM | POA: Diagnosis not present

## 2018-09-08 DIAGNOSIS — N390 Urinary tract infection, site not specified: Secondary | ICD-10-CM | POA: Diagnosis not present

## 2018-09-08 DIAGNOSIS — R6889 Other general symptoms and signs: Secondary | ICD-10-CM | POA: Diagnosis not present

## 2018-09-08 DIAGNOSIS — R9401 Abnormal electroencephalogram [EEG]: Secondary | ICD-10-CM | POA: Diagnosis not present

## 2018-09-08 DIAGNOSIS — A419 Sepsis, unspecified organism: Secondary | ICD-10-CM | POA: Diagnosis not present

## 2018-09-08 DIAGNOSIS — I6789 Other cerebrovascular disease: Secondary | ICD-10-CM | POA: Diagnosis not present

## 2018-09-08 DIAGNOSIS — Z7189 Other specified counseling: Secondary | ICD-10-CM | POA: Diagnosis not present

## 2018-09-08 DIAGNOSIS — G9340 Encephalopathy, unspecified: Secondary | ICD-10-CM | POA: Diagnosis not present

## 2018-09-08 DIAGNOSIS — R569 Unspecified convulsions: Secondary | ICD-10-CM | POA: Diagnosis not present

## 2018-09-08 DIAGNOSIS — C801 Malignant (primary) neoplasm, unspecified: Secondary | ICD-10-CM | POA: Diagnosis not present

## 2018-09-08 DIAGNOSIS — R410 Disorientation, unspecified: Secondary | ICD-10-CM | POA: Diagnosis not present

## 2018-09-08 DIAGNOSIS — Z66 Do not resuscitate: Secondary | ICD-10-CM | POA: Diagnosis not present

## 2018-09-08 DIAGNOSIS — Z9189 Other specified personal risk factors, not elsewhere classified: Secondary | ICD-10-CM | POA: Diagnosis not present

## 2018-09-08 DIAGNOSIS — R404 Transient alteration of awareness: Secondary | ICD-10-CM | POA: Diagnosis not present

## 2018-09-08 DIAGNOSIS — R571 Hypovolemic shock: Secondary | ICD-10-CM | POA: Diagnosis not present

## 2018-09-08 DIAGNOSIS — Z515 Encounter for palliative care: Secondary | ICD-10-CM | POA: Diagnosis not present

## 2018-09-08 DIAGNOSIS — G936 Cerebral edema: Secondary | ICD-10-CM | POA: Diagnosis not present

## 2018-09-08 DIAGNOSIS — G8921 Chronic pain due to trauma: Secondary | ICD-10-CM | POA: Diagnosis not present

## 2018-09-08 DIAGNOSIS — C7931 Secondary malignant neoplasm of brain: Secondary | ICD-10-CM | POA: Diagnosis not present

## 2018-09-08 DIAGNOSIS — E870 Hyperosmolality and hypernatremia: Secondary | ICD-10-CM | POA: Diagnosis not present

## 2018-09-08 DIAGNOSIS — G935 Compression of brain: Secondary | ICD-10-CM | POA: Diagnosis not present

## 2018-09-08 DIAGNOSIS — E872 Acidosis: Secondary | ICD-10-CM | POA: Diagnosis not present

## 2018-09-08 DIAGNOSIS — C439 Malignant melanoma of skin, unspecified: Secondary | ICD-10-CM | POA: Diagnosis not present

## 2018-09-08 DIAGNOSIS — Z85820 Personal history of malignant melanoma of skin: Secondary | ICD-10-CM | POA: Diagnosis not present

## 2018-09-08 DIAGNOSIS — N170 Acute kidney failure with tubular necrosis: Secondary | ICD-10-CM | POA: Diagnosis not present

## 2018-09-08 DIAGNOSIS — N2889 Other specified disorders of kidney and ureter: Secondary | ICD-10-CM | POA: Diagnosis not present

## 2018-09-08 DIAGNOSIS — N200 Calculus of kidney: Secondary | ICD-10-CM | POA: Diagnosis not present

## 2018-09-08 DIAGNOSIS — C799 Secondary malignant neoplasm of unspecified site: Secondary | ICD-10-CM | POA: Diagnosis not present

## 2018-09-08 DIAGNOSIS — E860 Dehydration: Secondary | ICD-10-CM | POA: Diagnosis not present

## 2018-11-12 DEATH — deceased
